# Patient Record
Sex: Female | Born: 1940 | Race: White | Hispanic: No | Marital: Married | Smoking: Never smoker
Health system: Southern US, Community
[De-identification: ages and names within clinical notes are randomized; demographics above are authoritative.]

## PROBLEM LIST (undated history)

## (undated) HISTORY — PX: ABDOMINAL HYSTERECTOMY: SHX81

---

## 2011-10-09 ENCOUNTER — Ambulatory Visit: Payer: Self-pay | Admitting: Internal Medicine

## 2012-06-17 IMAGING — CR DG CHEST 2V
1 series · 2 of 2 positions shown · non-contrast
Comparison: none

REASON FOR EXAM: cough, fever x 4d
COMMENTS:

[Series 1: view not recorded · 0.17mm/px · 2 of 2 slices shown]
[im 1/2]
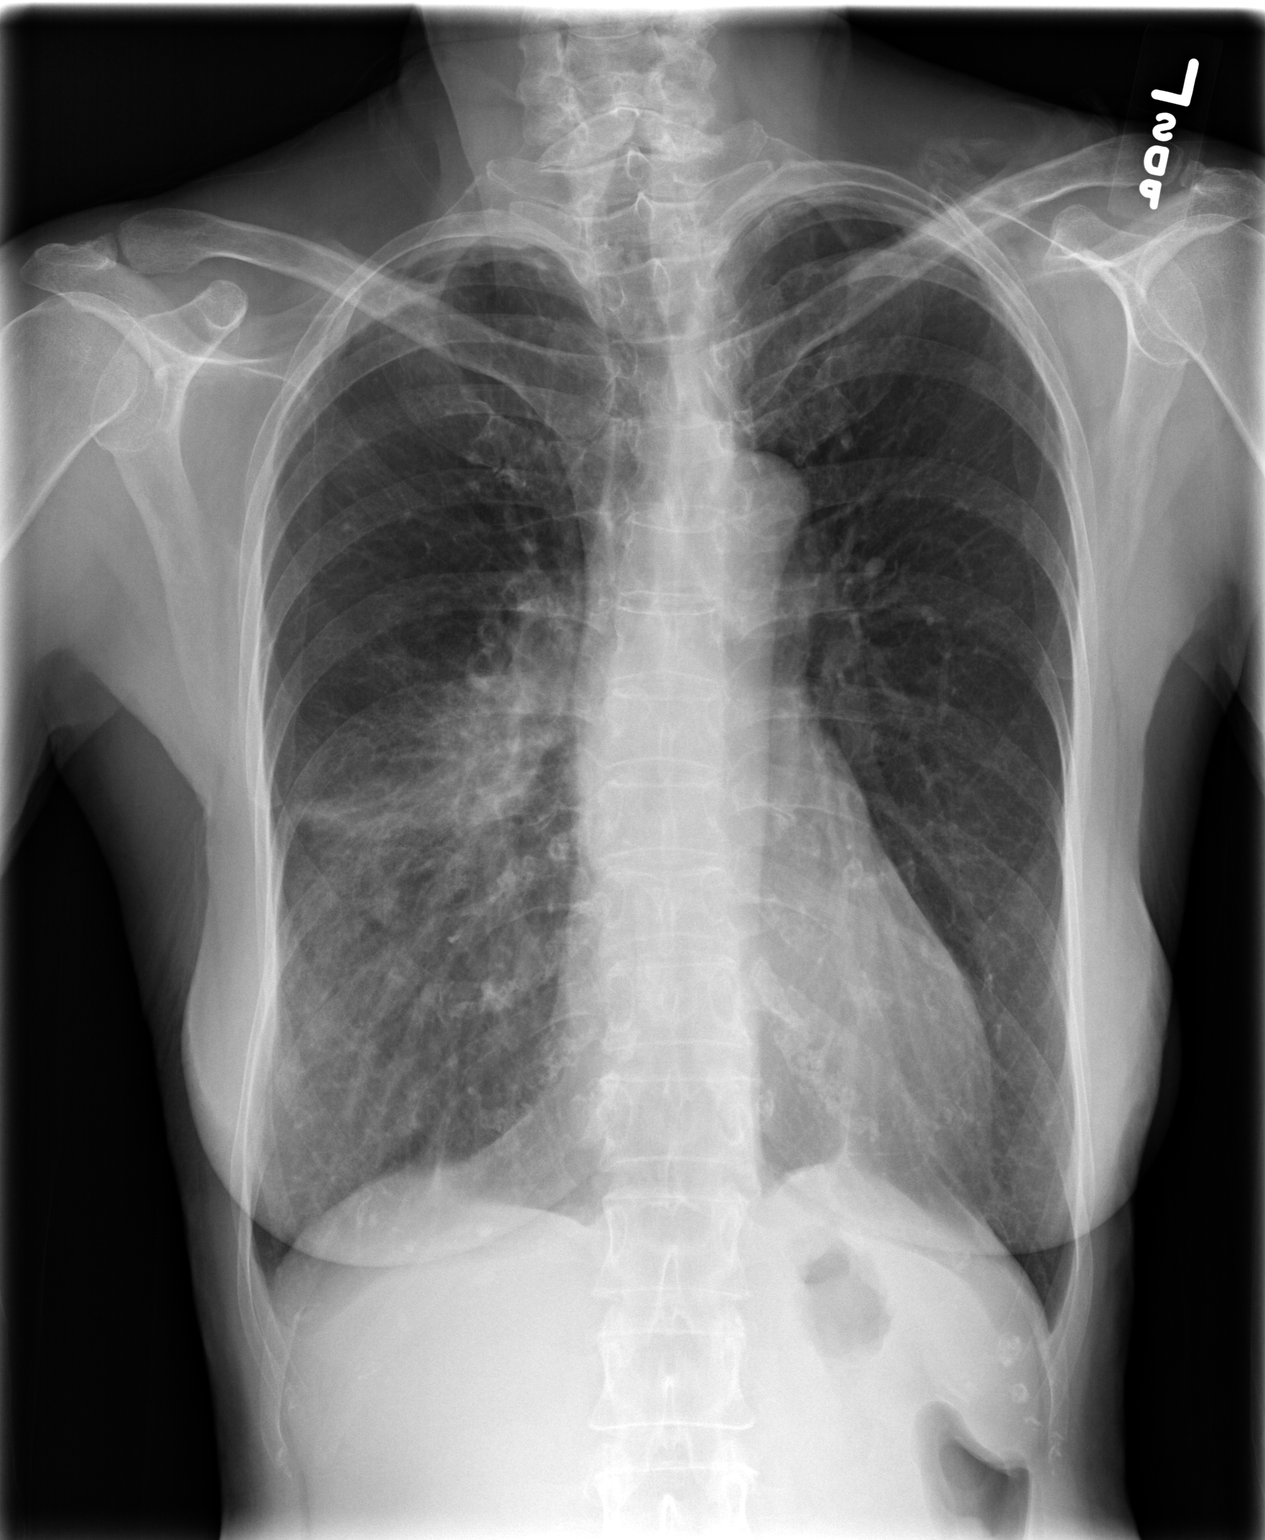
[im 2/2]
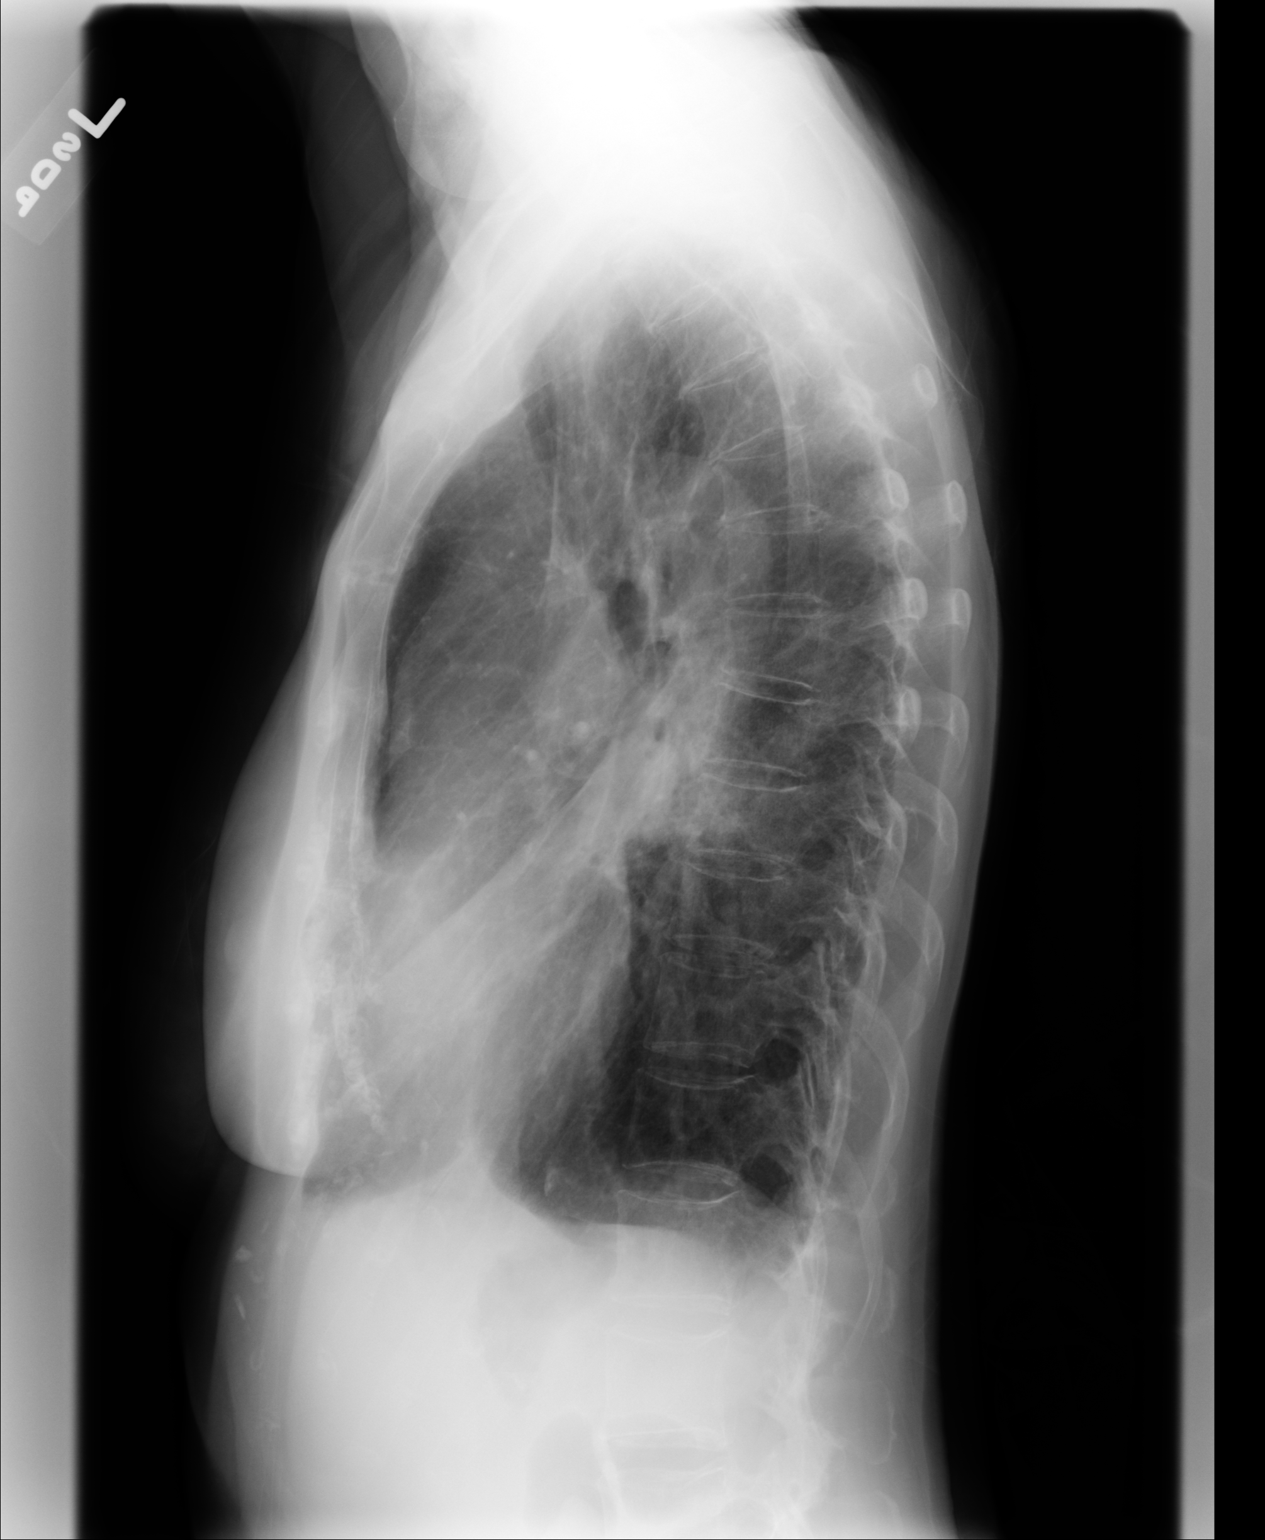

[2 of 2 positions shown; findings below may reference images not displayed]

PROCEDURE:     MDR - MDR CHEST PA(OR AP) AND LATERAL  - October 09, 2011  [DATE]

RESULT:     The lungs are hyperinflated consistent with COPD. Cardiac size
is borderline enlarged. Is increased density in the right mid lung
consistent with right upper lobe pneumonia. There is some peribronchial
thickening in the right hilar region suggestive of bronchitis. No effusion
or definite mass is identified. Followup to document clearing with PA and
lateral views of the chest is recommended and 6 weeks.
IMPRESSION: Right upper lobe pneumonia suspected. Postobstructive
atelectasis is not excluded. Possible accompanying bronchitis. Followup
radiograph recommended to document improvement as discussed above.

## 2016-05-08 ENCOUNTER — Ambulatory Visit
Admission: EM | Admit: 2016-05-08 | Discharge: 2016-05-08 | Disposition: A | Discharge status: Home / Self Care | Payer: Medicare Other | Attending: Emergency Medicine | Admitting: Emergency Medicine

## 2016-05-08 ENCOUNTER — Encounter: Payer: Self-pay | Admitting: *Deleted

## 2016-05-08 DIAGNOSIS — R319 Hematuria, unspecified: Secondary | ICD-10-CM | POA: Diagnosis present

## 2016-05-08 DIAGNOSIS — N390 Urinary tract infection, site not specified: Secondary | ICD-10-CM | POA: Diagnosis not present

## 2016-05-08 DIAGNOSIS — R300 Dysuria: Secondary | ICD-10-CM | POA: Diagnosis present

## 2016-05-08 LAB — CBC WITH DIFFERENTIAL/PLATELET
Basophils Absolute: 0.1 10*3/uL (ref 0–0.1)
EOS ABS: 0 10*3/uL (ref 0–0.7)
HCT: 34.4 % — ABNORMAL LOW (ref 35.0–47.0)
Hemoglobin: 11.5 g/dL — ABNORMAL LOW (ref 12.0–16.0)
Lymphocytes Relative: 8 %
Lymphs Abs: 1.2 10*3/uL (ref 1.0–3.6)
MCH: 28.5 pg (ref 26.0–34.0)
MCHC: 33.5 g/dL (ref 32.0–36.0)
MCV: 85.2 fL (ref 80.0–100.0)
MONO ABS: 0.5 10*3/uL (ref 0.2–0.9)
Neutro Abs: 13.4 10*3/uL — ABNORMAL HIGH (ref 1.4–6.5)
Neutrophils Relative %: 87 %
Platelets: 307 10*3/uL (ref 150–440)
RBC: 4.04 MIL/uL (ref 3.80–5.20)
RDW: 12.7 % (ref 11.5–14.5)
WBC: 15.4 10*3/uL — ABNORMAL HIGH (ref 3.6–11.0)

## 2016-05-08 LAB — BASIC METABOLIC PANEL
Anion gap: 8 (ref 5–15)
BUN: 30 mg/dL — ABNORMAL HIGH (ref 6–20)
CALCIUM: 9 mg/dL (ref 8.9–10.3)
CO2: 28 mmol/L (ref 22–32)
CREATININE: 1.01 mg/dL — AB (ref 0.44–1.00)
Chloride: 101 mmol/L (ref 101–111)
GFR calc non Af Amer: 52 mL/min — ABNORMAL LOW (ref >60)
Glucose, Bld: 127 mg/dL — ABNORMAL HIGH (ref 65–99)
Potassium: 3.6 mmol/L (ref 3.5–5.1)
SODIUM: 137 mmol/L (ref 135–145)

## 2016-05-08 LAB — URINALYSIS COMPLETE WITH MICROSCOPIC (ARMC ONLY)
Squamous Epithelial / LPF: NONE SEEN
WBC, UA: NONE SEEN WBC/hpf (ref 0–5)

## 2016-05-08 LAB — OCCULT BLOOD X 1 CARD TO LAB, STOOL: FECAL OCCULT BLD: NEGATIVE

## 2016-05-08 MED ORDER — CIPROFLOXACIN HCL 500 MG PO TABS: 500.0000 mg | ORAL_TABLET | Freq: Two times a day (BID) | ORAL | Status: AC

## 2016-05-08 NOTE — ED Provider Notes (Signed)
[facility]  ____________________________________________  Time seen: Approximately 8:29 AM  I have reviewed the triage vital signs and the nursing notes.   HISTORY  Chief Complaint Dysuria and Hematuria   HPI Audrey Shea is a 75 y.o. female presents for urinary frequency, urinary urgency and hematuria since last night. Patient states that she does not normally get up in the middle of night to use the restroom and states that she had to get to urinate 4-5 times over the night last night. Patient states that she felt fine during the day yesterday. Patient also reports burning with urination and some intermittent burning where she would urinate from in between actively urinating. Denies any bleeding vaginally. Denies rectal bleeding. Denies any abdominal pain or back pain. Denies incontinence. Denies fevers.   Denies history of same. Denies history of kidney stones. Patient reports 2 weeks ago she had a "GI bug" that cause her to have some nausea and vomiting and diarrhea for 2 days. Patient states at that time she felt like she had some blood in her stool since stool looked dark, but states since then no other blood in her stool and states that her stool has been normal colored since. Denies any dark stool, blood in stool or blood in toilet. Denies any urinary or bowel changes otherwise. Reports since has continued to eat and drink well and has felt well. Denies any other recent sickness. Denies any recent hospitalizations. Reports has continued to eat and drink well. Denies chest pain, shortness of breath, weakness, numbness, tingling sensation, abdominal pain, back pain, extremity pain, extremity swelling.Reports has not taken any medications today.  PCP: Hollice EspyMcCreary    pertinent past medical history. Right shoulder fracture  active problems to display for this patient. HTN Hyperlipidemia CKD  past surgical history. HYSTERECTOMY     LOBECTOMY PARTIAL THYROID      KNEE ARTHROSCOPY     CESAREAN SECTION          EXTRACTION CATARACT EXTRACAPSULAR W/INSERTION INTRAOCULAR PROSTHESIS 02/10/2016      Current Outpatient Rx  Name  Route  Sig  Dispense  Refill  . hydrochlorothiazide (MICROZIDE) 12.5 MG capsule   Oral   Take 12.5 mg by mouth daily.         Marland Kitchen. lisinopril (PRINIVIL,ZESTRIL) 10 MG tablet   Oral   Take 10 mg by mouth daily.           Allergies Review of patient's allergies indicates no known allergies.   family history. Father: Heart Disease  Social History Social History  Substance Use Topics  . Smoking status: Never Smoker   . Smokeless tobacco: None  . Alcohol Use: No    Review of Systems Constitutional: No fever/chills Eyes: No visual changes. ENT: No sore throat. Cardiovascular: Denies chest pain. Respiratory: Denies shortness of breath. Gastrointestinal: No abdominal pain.  No nausea, no vomiting.  No diarrhea.  No constipation. Genitourinary: Positive for dysuria. Musculoskeletal: Negative for back pain. Skin: Negative for rash. Neurological: Negative for headaches, focal weakness or numbness.  10-point ROS otherwise negative.  ____________________________________________   PHYSICAL EXAM:  VITAL SIGNS: ED Triage Vitals  Enc Vitals Group     BP 05/08/16 0808 110/48 mmHg     Pulse Rate 05/08/16 0808 84     Resp 05/08/16 0808 24     Temp 05/08/16 0808 97.7 F (36.5 C)     Temp src --      SpO2 05/08/16 0808 96 %  Weight 05/08/16 0808 100 lb (45.36 kg)     Height 05/08/16 0808 5\' 4"  (1.626 m)     Head Cir --      Peak Flow --      Pain Score 05/08/16 0816 10     Pain Loc --      Pain Edu? --      Excl. in GC? --    Today's Vitals   05/08/16 0808 05/08/16 0816 05/08/16 0848  BP: 110/48  118/60  Pulse: 84  77  Temp: 97.7 F (36.5 C)    Resp: 24  16  Height: 5\' 4"  (1.626 m)    Weight: 100 lb (45.36 kg)    SpO2: 96%  97%  PainSc:  10-Worst pain ever     Constitutional: Alert and  oriented. Well appearing and in no acute distress. Eyes: Conjunctivae are normal. PERRL. EOMI. Head: Atraumatic.  Mouth/Throat: Mucous membranes are moist.  Oropharynx non-erythematous. Neck: No stridor.  No cervical spine tenderness to palpation. Hematological/Lymphatic/Immunilogical: No cervical lymphadenopathy. Cardiovascular: Normal rate, regular rhythm. Grossly normal heart sounds.  Good peripheral circulation. Respiratory: Normal respiratory effort.  No retractions. Lungs CTAB. No wheezes, rales or rhonchi.  Gastrointestinal: Soft and nontender. No distention. Normal Bowel sounds. No CVA tenderness. Rectal: Rectal exam completed with Tamela Oddi RN at bedside and chaperone. External: No lesions, no external hemorrhoids noted. Internal: No gross blood. No palpable mass or hemorrhoids. Musculoskeletal: No lower or upper extremity tenderness nor edema.  No cervical, thoracic or lumbar tenderness to palpation. Neurologic:  Normal speech and language. No gross focal neurologic deficits are appreciated. No gait instability. Skin:  Skin is warm, dry and intact. No rash noted. Psychiatric: Mood and affect are normal. Speech and behavior are normal.   ____________________________________________   LABS (all labs ordered are listed, but only abnormal results are displayed)  Labs Reviewed  URINALYSIS COMPLETEWITH MICROSCOPIC ([facility] ONLY) - Abnormal; Notable for the following:    Color, Urine RED (*)    APPearance TURBID (*)    Glucose, UA   (*)    Value: TEST NOT REPORTED DUE TO COLOR INTERFERENCE OF URINE PIGMENT   Bilirubin Urine   (*)    Value: TEST NOT REPORTED DUE TO COLOR INTERFERENCE OF URINE PIGMENT   Ketones, ur   (*)    Value: TEST NOT REPORTED DUE TO COLOR INTERFERENCE OF URINE PIGMENT   Hgb urine dipstick   (*)    Value: TEST NOT REPORTED DUE TO COLOR INTERFERENCE OF URINE PIGMENT   Protein, ur   (*)    Value: TEST NOT REPORTED DUE TO COLOR INTERFERENCE OF URINE PIGMENT   Nitrite    (*)    Value: TEST NOT REPORTED DUE TO COLOR INTERFERENCE OF URINE PIGMENT   Leukocytes, UA   (*)    Value: TEST NOT REPORTED DUE TO COLOR INTERFERENCE OF URINE PIGMENT   Bacteria, UA MANY (*)    All other components within normal limits  CBC WITH DIFFERENTIAL/PLATELET - Abnormal; Notable for the following:    WBC 15.4 (*)    Hemoglobin 11.5 (*)    HCT 34.4 (*)    Neutro Abs 13.4 (*)    All other components within normal limits  BASIC METABOLIC PANEL - Abnormal; Notable for the following:    Glucose, Bld 127 (*)    BUN 30 (*)    Creatinine, Ser 1.01 (*)    GFR calc non Af Amer 52 (*)    All other components within  normal limits  URINE CULTURE  OCCULT BLOOD X 1 CARD TO LAB, STOOL    RADIOLOGY  No results found.   INITIAL IMPRESSION / ASSESSMENT AND PLAN / ED COURSE  Pertinent labs & imaging results that were available during my care of the patient were reviewed by me and considered in my medical decision making (see chart for details).  Well-appearing patient. Presenting for the complaints of urinary frequency, urinary urgency, hematuria since last night. Also reports accompanying urinary burning sensation. Will evaluate urinalysis, CBC, BMP and Hemoccult. History of CKD and via epic 01/21/15 GFR 54, 04/30/15 GFR 55, 02/24/16 GFR 58, BUN 30.   Patient and plan of care reviewed with Dr. Chaney Malling who reviewed patient and labs, and agrees with plan. Labs and urinalysis reviewed. Stool occult negative. WBC 15.4. Kidney function similar to baseline according to labs obtained by Epic. Urinalysis positive for many bacteria, too numerous to count RBCs. Will culture urine. No flank pain, no abdominal pain, doubt kidney stone.  Patient ambulatory in room a steady gait. Suspect urinary tract infection causing hematuria. Will treat with oral Cipro. Encourage close PCP follow-up. Directed to follow-up with PCP in 1-2 days. Discussed in detail with patient to seek immediate follow-up including  proceeding directly to the emergency room for abdominal pain, fevers, increased bleeding, flank pain, dizziness, weakness, rectal bleeding, new or worsening concerns.Discussed indication, risks and benefits of medications with patient.  Discussed follow up with Primary care physician this week. Discussed follow up and return parameters including no resolution or any worsening concerns. Patient verbalized understanding and agreed to plan.   ____________________________________________   FINAL CLINICAL IMPRESSION(S) / ED DIAGNOSES  Final diagnoses:  UTI (lower urinary tract infection)  Hematuria     Discharge Medication List as of 05/08/2016  9:22 AM    START taking these medications   Details  ciprofloxacin (CIPRO) 500 MG tablet Take 1 tablet (500 mg total) by mouth 2 (two) times daily., Starting 05/08/2016, Until Discontinued, Normal        Note: This dictation was prepared with Dragon dictation along with smaller phrase technology. Any transcriptional errors that result from this process are unintentional.       Renford Dills, NP 05/08/16 [phone]

## 2016-05-08 NOTE — Discharge Instructions (Signed)
Take medication as prescribed. Rest. Drink plenty of fluids.   Follow up with your primary care physician in 1-2 days. Close follow up is very important.   Return to Urgent care or ER for dizziness, weakness, fevers, abdominal pain, rectal bleeding, new or worsening concerns.    Urinary Tract Infection A urinary tract infection (UTI) can occur any place along the urinary tract. The tract includes the kidneys, ureters, bladder, and urethra. A type of germ called bacteria often causes a UTI. UTIs are often helped with antibiotic medicine.  HOME CARE   If given, take antibiotics as told by your doctor. Finish them even if you start to feel better.  Drink enough fluids to keep your pee (urine) clear or pale yellow.  Avoid tea, drinks with caffeine, and bubbly (carbonated) drinks.  Pee often. Avoid holding your pee in for a long time.  Pee before and after having sex (intercourse).  Wipe from front to back after you poop (bowel movement) if you are a woman. Use each tissue only once. GET HELP RIGHT AWAY IF:   You have back pain.  You have lower belly (abdominal) pain.  You have chills.  You feel sick to your stomach (nauseous).  You throw up (vomit).  Your burning or discomfort with peeing does not go away.  You have a fever.  Your symptoms are not better in 3 days. MAKE SURE YOU:   Understand these instructions.  Will watch your condition.  Will get help right away if you are not doing well or get worse.   This information is not intended to replace advice given to you by your health care provider. Make sure you discuss any questions you have with your health care provider.   Document Released: 05/08/2008 Document Revised: 12/11/2014 Document Reviewed: 06/20/2012 Elsevier Interactive Patient Education 2016 [employer]  Hematuria, Adult Hematuria is blood in your urine. It can be caused by a bladder infection, kidney infection, prostate infection, kidney stone, or  cancer of your urinary tract. Infections can usually be treated with medicine, and a kidney stone usually will pass through your urine. If neither of these is the cause of your hematuria, further workup to find out the reason may be needed. It is very important that you tell your health care provider about any blood you see in your urine, even if the blood stops without treatment or happens without causing pain. Blood in your urine that happens and then stops and then happens again can be a symptom of a very serious condition. Also, pain is not a symptom in the initial stages of many urinary cancers. HOME CARE INSTRUCTIONS   Drink lots of fluid, 3-4 quarts a day. If you have been diagnosed with an infection, cranberry juice is especially recommended, in addition to large amounts of water.  Avoid caffeine, tea, and carbonated beverages because they tend to irritate the bladder.  Avoid alcohol because it may irritate the prostate.  Take all medicines as directed by your health care provider.  If you were prescribed an antibiotic medicine, finish it all even if you start to feel better.  If you have been diagnosed with a kidney stone, follow your health care provider's instructions regarding straining your urine to catch the stone.  Empty your bladder often. Avoid holding urine for long periods of time.  After a bowel movement, women should cleanse front to back. Use each tissue only once.  Empty your bladder before and after sexual intercourse if you are  a female. SEEK MEDICAL CARE IF:  You develop back pain.  You have a fever.  You have a feeling of sickness in your stomach (nausea) or vomiting.  Your symptoms are not better in 3 days. Return sooner if you are getting worse. SEEK IMMEDIATE MEDICAL CARE IF:   You develop severe vomiting and are unable to keep the medicine down.  You develop severe back or abdominal pain despite taking your medicines.  You begin passing a large  amount of blood or clots in your urine.  You feel extremely weak or faint, or you pass out. MAKE SURE YOU:   Understand these instructions.  Will watch your condition.  Will get help right away if you are not doing well or get worse.   This information is not intended to replace advice given to you by your health care provider. Make sure you discuss any questions you have with your health care provider.   Document Released: 11/20/2005 Document Revised: 12/11/2014 Document Reviewed: 07/21/2013 Elsevier Interactive Patient Education [employer]

## 2016-05-08 NOTE — ED Notes (Signed)
Painful urination and hematuria, onset during the night.

## 2016-05-10 LAB — URINE CULTURE: Culture: >=100000 — AB

## 2017-05-22 ENCOUNTER — Ambulatory Visit
Admission: EM | Admit: 2017-05-22 | Discharge: 2017-05-22 | Disposition: A | Discharge status: Home / Self Care | Payer: Medicare Other | Attending: Family Medicine | Admitting: Family Medicine

## 2017-05-22 DIAGNOSIS — L247 Irritant contact dermatitis due to plants, except food: Secondary | ICD-10-CM | POA: Diagnosis not present

## 2017-05-22 DIAGNOSIS — R21 Rash and other nonspecific skin eruption: Secondary | ICD-10-CM | POA: Diagnosis not present

## 2017-05-22 MED ORDER — PREDNISONE 10 MG (21) PO TBPK: ORAL_TABLET | ORAL | 0 refills | Status: AC

## 2017-05-22 NOTE — ED Provider Notes (Signed)
[facility]    CSN: [id number] Arrival date & time: 05/22/17  1043     History   Chief Complaint Chief Complaint  Patient presents with  . Rash    HPI Audrey Shea is a 76 y.o. female.   Patient is a 76 year old white female with a rash over both sides of her face over her eyebrows with the left side of her face worse in the right. She states everything started yesterday and has gotten worse with the right side being involved today. She states that she gets ports not reports no quite often and this is consistent with a poison ivy and poison oak that she's had before. She states the prednisone seemed to be effective and fixes the problem.   The history is provided by the patient.  Rash  Location:  Face Facial rash location:  Forehead, L eyebrow, R eyebrow and face Quality: blistering and redness   Severity:  Moderate Progression:  Worsening Chronicity:  New Relieved by:  Nothing Ineffective treatments:  None tried   History reviewed. No pertinent past medical history.  There are no active problems to display for this patient.   Past Surgical History:  Procedure Laterality Date  . ABDOMINAL HYSTERECTOMY    . CESAREAN SECTION      OB History    No data available       Home Medications    Prior to Admission medications   Medication Sig Start Date End Date Taking? Authorizing Provider  hydrochlorothiazide (MICROZIDE) 12.5 MG capsule Take 12.5 mg by mouth daily.   Yes [provider]  lisinopril (PRINIVIL,ZESTRIL) 10 MG tablet Take 10 mg by mouth daily.   Yes [provider]  ciprofloxacin (CIPRO) 500 MG tablet Take 1 tablet (500 mg total) by mouth 2 (two) times daily. 05/08/16   Renford DillsMiller, Lindsey, NP  predniSONE (STERAPRED UNI-PAK 21 TAB) 10 MG (21) TBPK tablet 6 tabs day 1 and 2, 5 tabs day 3 and 4, 4 tabs day 5 and 6, 3 tabs day 7 and 8, 2 tabs day 9 and 10, 1 tab day 11 and 12. Take orally 05/22/17   Hassan RowanWade, Amalie Koran, MD     Family History History reviewed. No pertinent family history.  Social History Social History  Substance Use Topics  . Smoking status: Never Smoker  . Smokeless tobacco: Never Used  . Alcohol use No     Allergies   Patient has no known allergies.   Review of Systems Review of Systems  Skin: Positive for rash.  All other systems reviewed and are negative.    Physical Exam Triage Vital Signs ED Triage Vitals  Enc Vitals Group     BP 05/22/17 1237 (!) 131/55     Pulse Rate 05/22/17 1237 68     Resp 05/22/17 1237 18     Temp 05/22/17 1237 97.7 F (36.5 C)     Temp Source 05/22/17 1237 Oral     SpO2 05/22/17 1237 100 %     Weight 05/22/17 1235 92 lb (41.7 kg)     Height 05/22/17 1235 5\' 3"  (1.6 m)     Head Circumference --      Peak Flow --      Pain Score --      Pain Loc --      Pain Edu? --      Excl. in GC? --    No data found.   Updated Vital Signs BP (!) 131/55 (BP Location:  Left Arm)   Pulse 68   Temp 97.7 F (36.5 C) (Oral)   Resp 18   Ht 5\' 3"  (1.6 m)   Wt 92 lb (41.7 kg)   SpO2 100%   BMI 16.30 kg/m   Visual Acuity Right Eye Distance:   Left Eye Distance:   Bilateral Distance:    Right Eye Near:   Left Eye Near:    Bilateral Near:     Physical Exam  Constitutional: She appears well-developed and well-nourished.  HENT:  Head: Normocephalic and atraumatic.    Right Ear: External ear normal.  Left Ear: External ear normal.  Eyes: Conjunctivae are normal. Pupils are equal, round, and reactive to light.  Neck: Normal range of motion.  Pulmonary/Chest: Effort normal.  Musculoskeletal: Normal range of motion.  Neurological: She is alert.  Skin: There is erythema.     On both sides the face patient has a rash consistent with poison ivy and poison oak with left side being worse than the right side most of rashes above the eyebrows some on the forehead as well  Psychiatric: She has a normal mood and affect.  Vitals  reviewed.    UC Treatments / Results  Labs (all labs ordered are listed, but only abnormal results are displayed) Labs Reviewed - No data to display  EKG  EKG Interpretation None       Radiology No results found.  Procedures Procedures (including critical care time)  Medications Ordered in UC Medications - No data to display   Initial Impression / Assessment and Plan / UC Course  I have reviewed the triage vital signs and the nursing notes.  Pertinent labs & imaging results that were available during my care of the patient were reviewed by me and considered in my medical decision making (see chart for details).     We'll place on 12 day course of prednisone follow-up with PCP as needed.  Final Clinical Impressions(s) / UC Diagnoses   Final diagnoses:  Irritant contact dermatitis due to plants, except food    New Prescriptions New Prescriptions   PREDNISONE (STERAPRED UNI-PAK 21 TAB) 10 MG (21) TBPK TABLET    6 tabs day 1 and 2, 5 tabs day 3 and 4, 4 tabs day 5 and 6, 3 tabs day 7 and 8, 2 tabs day 9 and 10, 1 tab day 11 and 12. Take orally    Note: This dictation was prepared with Dragon dictation along with smaller phrase technology. Any transcriptional errors that result from this process are unintentional.   Hassan Rowan, MD 05/22/17 1259

## 2017-05-22 NOTE — ED Triage Notes (Signed)
Pt states rash begin last night on right side of face - right eye.
